# Patient Record
Sex: Female | Born: 1980 | Race: White | Hispanic: No | Marital: Married
Health system: Southern US, Community
[De-identification: ages and names within clinical notes are randomized; demographics above are authoritative.]

---

## 2019-03-22 DIAGNOSIS — Z3482 Encounter for supervision of other normal pregnancy, second trimester: Secondary | ICD-10-CM | POA: Diagnosis not present

## 2019-03-22 DIAGNOSIS — Z3483 Encounter for supervision of other normal pregnancy, third trimester: Secondary | ICD-10-CM | POA: Diagnosis not present

## 2019-04-20 DIAGNOSIS — Z3482 Encounter for supervision of other normal pregnancy, second trimester: Secondary | ICD-10-CM | POA: Diagnosis not present

## 2019-04-20 DIAGNOSIS — Z3483 Encounter for supervision of other normal pregnancy, third trimester: Secondary | ICD-10-CM | POA: Diagnosis not present

## 2019-08-04 DIAGNOSIS — Z719 Counseling, unspecified: Secondary | ICD-10-CM | POA: Diagnosis not present

## 2019-08-04 DIAGNOSIS — Z01419 Encounter for gynecological examination (general) (routine) without abnormal findings: Secondary | ICD-10-CM | POA: Diagnosis not present

## 2019-08-04 DIAGNOSIS — Z9229 Personal history of other drug therapy: Secondary | ICD-10-CM | POA: Diagnosis not present

## 2019-08-04 DIAGNOSIS — E663 Overweight: Secondary | ICD-10-CM | POA: Diagnosis not present

## 2019-10-13 ENCOUNTER — Other Ambulatory Visit: Payer: Self-pay

## 2019-10-13 ENCOUNTER — Ambulatory Visit (INDEPENDENT_AMBULATORY_CARE_PROVIDER_SITE_OTHER): Payer: 59

## 2019-10-13 ENCOUNTER — Ambulatory Visit
Admission: RE | Admit: 2019-10-13 | Discharge: 2019-10-13 | Disposition: A | Discharge status: Home / Self Care | Payer: 59 | Source: Ambulatory Visit | Attending: Emergency Medicine | Admitting: Emergency Medicine

## 2019-10-13 VITALS — BP 130/77 | HR 65 | Temp 98.1°F | Resp 15

## 2019-10-13 DIAGNOSIS — W19XXXA Unspecified fall, initial encounter: Secondary | ICD-10-CM

## 2019-10-13 DIAGNOSIS — M545 Low back pain, unspecified: Secondary | ICD-10-CM

## 2019-10-13 LAB — POCT URINE PREGNANCY: Preg Test, Ur: NEGATIVE

## 2019-10-13 MED ORDER — ACETAMINOPHEN 500 MG PO TABS: 500.0000 mg | ORAL_TABLET | Freq: Four times a day (QID) | ORAL | 0 refills | Status: AC | PRN

## 2019-10-13 MED ORDER — ACETAMINOPHEN 500 MG PO TABS: 500.0000 mg | ORAL_TABLET | Freq: Four times a day (QID) | ORAL | 0 refills | Status: DC | PRN

## 2019-10-13 MED ORDER — DEXAMETHASONE SODIUM PHOSPHATE 10 MG/ML IJ SOLN
10.0000 mg | Freq: Once | INTRAMUSCULAR | Status: AC
  Administered 2019-10-13: 10 mg via INTRAMUSCULAR

## 2019-10-13 MED ORDER — PREDNISONE 10 MG (21) PO TBPK: ORAL_TABLET | ORAL | 0 refills | Status: AC

## 2019-10-13 MED ORDER — PREDNISONE 10 MG (21) PO TBPK: ORAL_TABLET | ORAL | 0 refills | Status: DC

## 2019-10-13 NOTE — ED Provider Notes (Addendum)
[facility]   [id number] 10/13/19 Arrival Time: 1007   Chief Complaint  Patient presents with  . Back Pain      SUBJECTIVE: History from: patient.  Cassie Montes is a 39 y.o. female who presented to the urgent care with a complaint of low pain that occurred this past Saturday.  Reported she fell.  She localizes the pain to the right low back.  She describes the pain as constant and achy.  She has tried OTC medications without relief.  Her symptoms are made worse with ROM.  She denies similar symptoms in the past.    ROS: As per HPI.  All other pertinent ROS negative.     History reviewed. No pertinent past medical history. History reviewed. No pertinent surgical history. No Known Allergies No current facility-administered medications on file prior to encounter.   No current outpatient medications on file prior to encounter.   Social History   Socioeconomic History  . Marital status: Married    Spouse name: Not on file  . Number of children: Not on file  . Years of education: Not on file  . Highest education level: Not on file  Occupational History  . Not on file  Tobacco Use  . Smoking status: Not on file  Substance and Sexual Activity  . Alcohol use: Not on file  . Drug use: Not on file  . Sexual activity: Not on file  Other Topics Concern  . Not on file  Social History Narrative  . Not on file   Social Determinants of Health   Financial Resource Strain:   . Difficulty of Paying Living Expenses: Not on file  Food Insecurity:   . Worried About Programme researcher, broadcasting/film/video in the Last Year: Not on file  . Ran Out of Food in the Last Year: Not on file  Transportation Needs:   . Lack of Transportation (Medical): Not on file  . Lack of Transportation (Non-Medical): Not on file  Physical Activity:   . Days of Exercise per Week: Not on file  . Minutes of Exercise per Session: Not on file  Stress:   . Feeling of Stress : Not on file  Social Connections:     . Frequency of Communication with Friends and Family: Not on file  . Frequency of Social Gatherings with Friends and Family: Not on file  . Attends Religious Services: Not on file  . Active Member of Clubs or Organizations: Not on file  . Attends Banker Meetings: Not on file  . Marital Status: Not on file  Intimate Partner Violence:   . Fear of Current or Ex-Partner: Not on file  . Emotionally Abused: Not on file  . Physically Abused: Not on file  . Sexually Abused: Not on file   History reviewed. No pertinent family history.  OBJECTIVE:  Vitals:   10/13/19 1021  BP: 130/77  Pulse: 65  Resp: 15  Temp: 98.1 F (36.7 C)  SpO2: 99%     Physical Exam Vitals and nursing note reviewed.  Constitutional:      General: She is not in acute distress.    Appearance: Normal appearance. She is normal weight. She is not ill-appearing, toxic-appearing or diaphoretic.  HENT:     Head: Normocephalic.  Cardiovascular:     Rate and Rhythm: Normal rate and regular rhythm.     Pulses: Normal pulses.     Heart sounds: Normal heart sounds. No murmur heard.  No friction rub.  No gallop.   Pulmonary:     Effort: Pulmonary effort is normal. No respiratory distress.     Breath sounds: Normal breath sounds. No stridor. No wheezing, rhonchi or rales.  Chest:     Chest wall: No tenderness.  Musculoskeletal:        General: Tenderness present.     Lumbar back: Spasms and tenderness present.     Comments: Back:  Patient ambulates from chair to exam table without difficulty.  Inspection: Skin clear and intact without obvious swelling, erythema, or ecchymosis. Warm to the touch  Palpation: Vertebral processes nontender. Tenderness about the lower right paravertebral muscles  ROM: FROM Strength: 5/5 hip flexion, 5/5 knee extension, 5/5 knee flexion, 5/5 plantar flexion, 5/5 dorsiflexion    Neurological:     Mental Status: She is alert and oriented to person, place, and time.       LABS:  Results for orders placed or performed during the hospital encounter of 10/13/19 (from the past 24 hour(s))  POCT urine pregnancy     Status: None   Collection Time: 10/13/19 10:48 AM  Result Value Ref Range   Preg Test, Ur Negative Negative    RADIOLOGY:  DG Lumbar Spine Complete  Result Date: 10/13/2019 CLINICAL DATA:  Larey Seat 5 days ago. Back pain. EXAM: LUMBAR SPINE - COMPLETE 4+ VIEW COMPARISON:  None. FINDINGS: Normal alignment of the lumbar vertebral bodies. Disc spaces and vertebral bodies are maintained. No acute lumbar spine fracture. The facets are normally aligned. No pars defects. The visualized bony pelvis is intact. Paraspinal soft tissue densities are maintained. IMPRESSION: Normal alignment and no acute bony findings. Electronically Signed   By: Rudie Meyer M.D.   On: 10/13/2019 11:11    Lumbar spine x-ray is negative for bony abnormality including fracture or dislocation.  I have reviewed the x-ray myself and the radiologist interpretation.  I am in agreement with the radiologist interpretation.  ASSESSMENT & PLAN:  1. Fall, initial encounter   2. Acute low back pain without sciatica, unspecified back pain laterality     Meds ordered this encounter  Medications  . dexamethasone (DECADRON) injection 10 mg  . acetaminophen (TYLENOL) 500 MG tablet    Sig: Take 1 tablet (500 mg total) by mouth every 6 (six) hours as needed.    Dispense:  30 tablet    Refill:  0  . predniSONE (STERAPRED UNI-PAK 21 TAB) 10 MG (21) TBPK tablet    Sig: Take 6 tabs by mouth daily  for 1 days, then 5 tabs for 1 days, then 4 tabs for 1 days, then 3 tabs for 1 days, 2 tabs for 1 days, then 1 tab by mouth daily for 1 days    Dispense:  21 tablet    Refill:  0    Rest, ice and heat as needed Ensure adequate ROM as tolerated. Prescribed prednisone taper Continue to use OTC Tylenol as needed for pain Return here or go to ER if you have any new or worsening symptoms such as  numbness/tingling of the inner thighs, loss of bladder or bowel control, headache/blurry vision, nausea/vomiting, confusion/altered mental status, dizziness, weakness, passing out, imbalance, etc...    Reviewed expectations re: course of current medical issues. Questions answered. Outlined signs and symptoms indicating need for more acute intervention. Patient verbalized understanding. After Visit Summary given.         Durward Parcel, FNP 10/13/19 1127    Durward Parcel, FNP 10/13/19 1128

## 2019-10-13 NOTE — ED Triage Notes (Signed)
Patient states that she fell last saturday, has some tendeness and soreness along right side lower back/hip area. Patient is ambulatory and walking well.

## 2019-10-13 NOTE — Discharge Instructions (Addendum)
Rest, ice and heat as needed Ensure adequate ROM as tolerated. Prescribed prednisone taper Continue to use OTC Tylenol as needed for pain Return here or go to ER if you have any new or worsening symptoms such as numbness/tingling of the inner thighs, loss of bladder or bowel control, headache/blurry vision, nausea/vomiting, confusion/altered mental status, dizziness, weakness, passing out, imbalance, etc..Marland Kitchen

## 2021-05-31 IMAGING — DX DG LUMBAR SPINE COMPLETE 4+V
5 series · 5 of 5 positions shown · non-contrast
Comparison: None.

CLINICAL DATA: Fell 5 days ago. Back pain.

EXAM:
LUMBAR SPINE - COMPLETE 4+ VIEW

[lumbar spine ap]
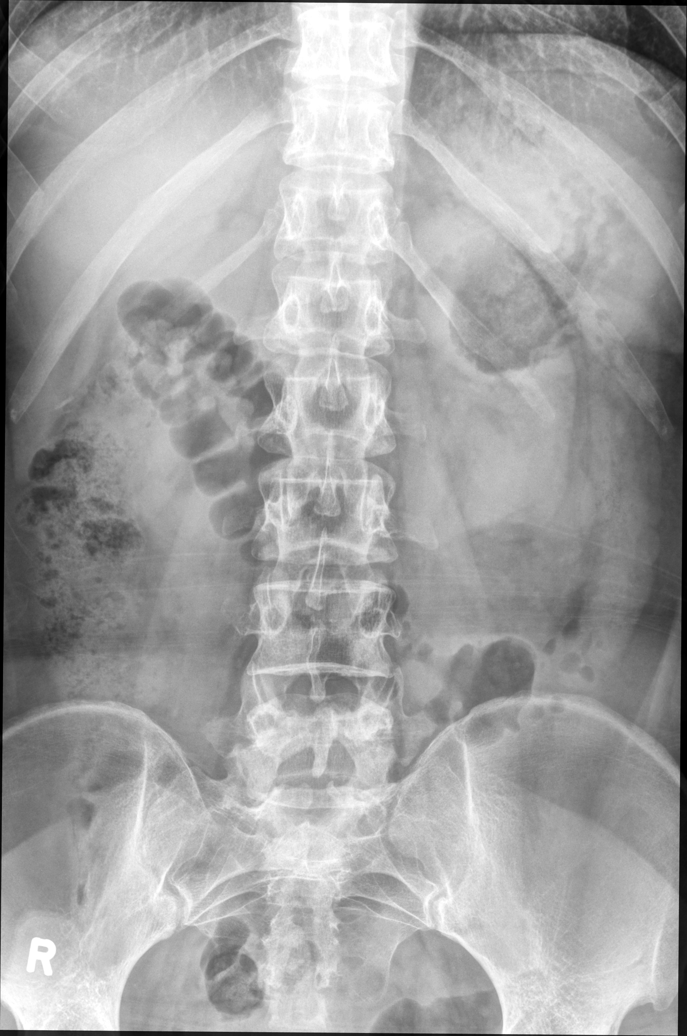

[lumbar spine mlo (1 of 2)]
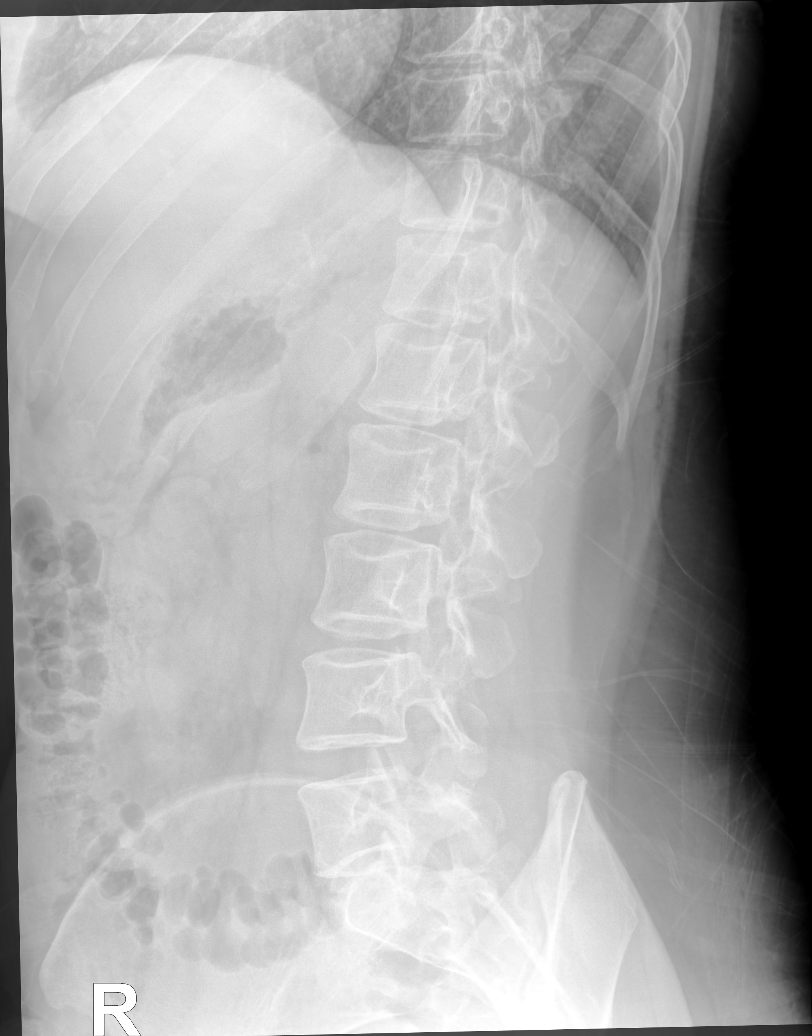

[lumbar spine mlo (2 of 2)]
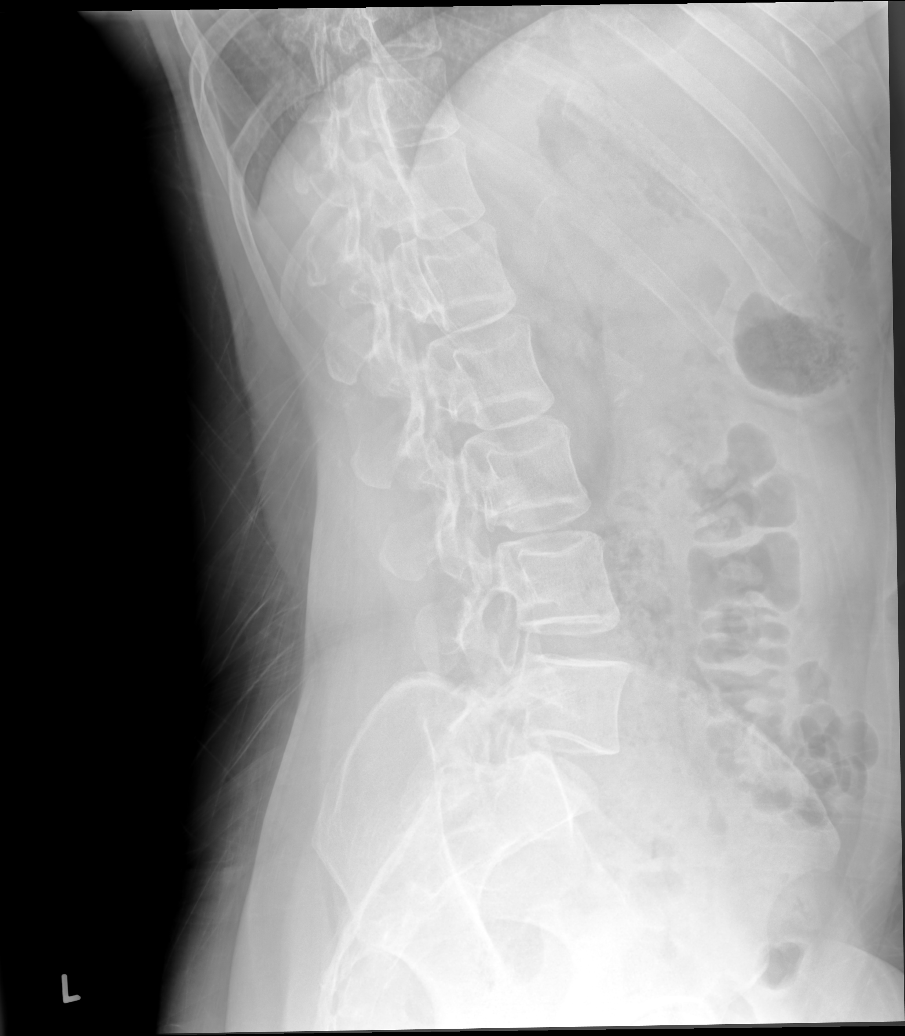

[lumbar spine lat (1 of 2)]
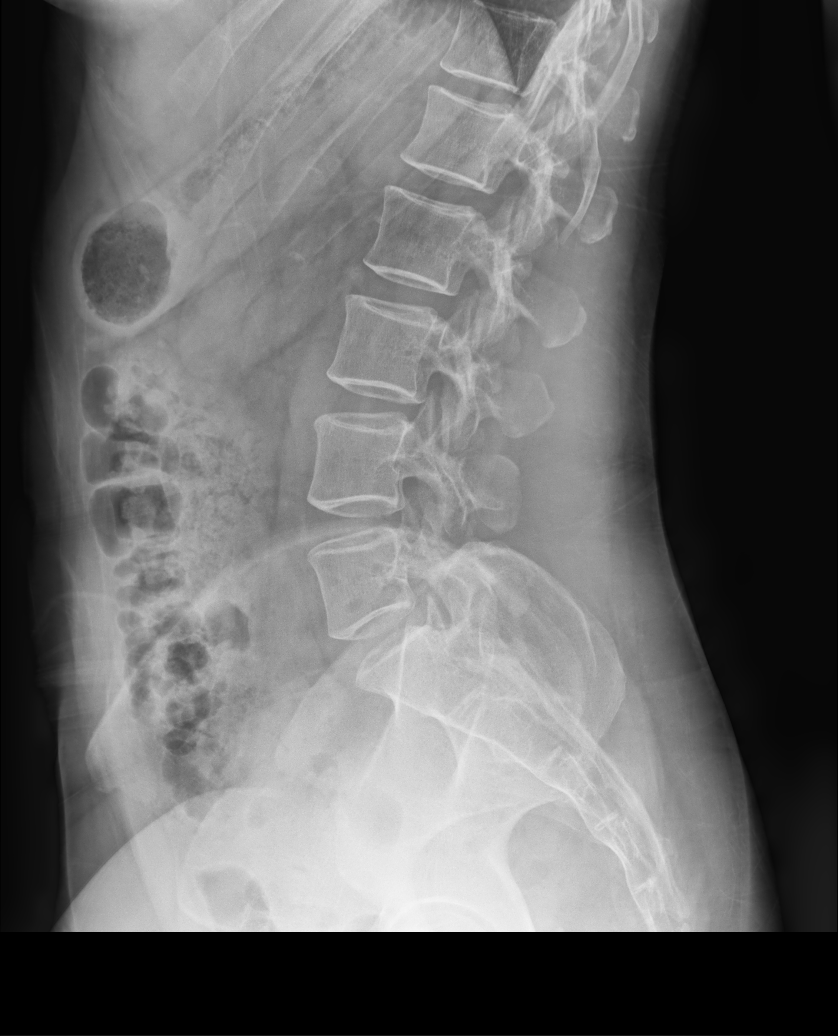

[lumbar spine lat (2 of 2)]
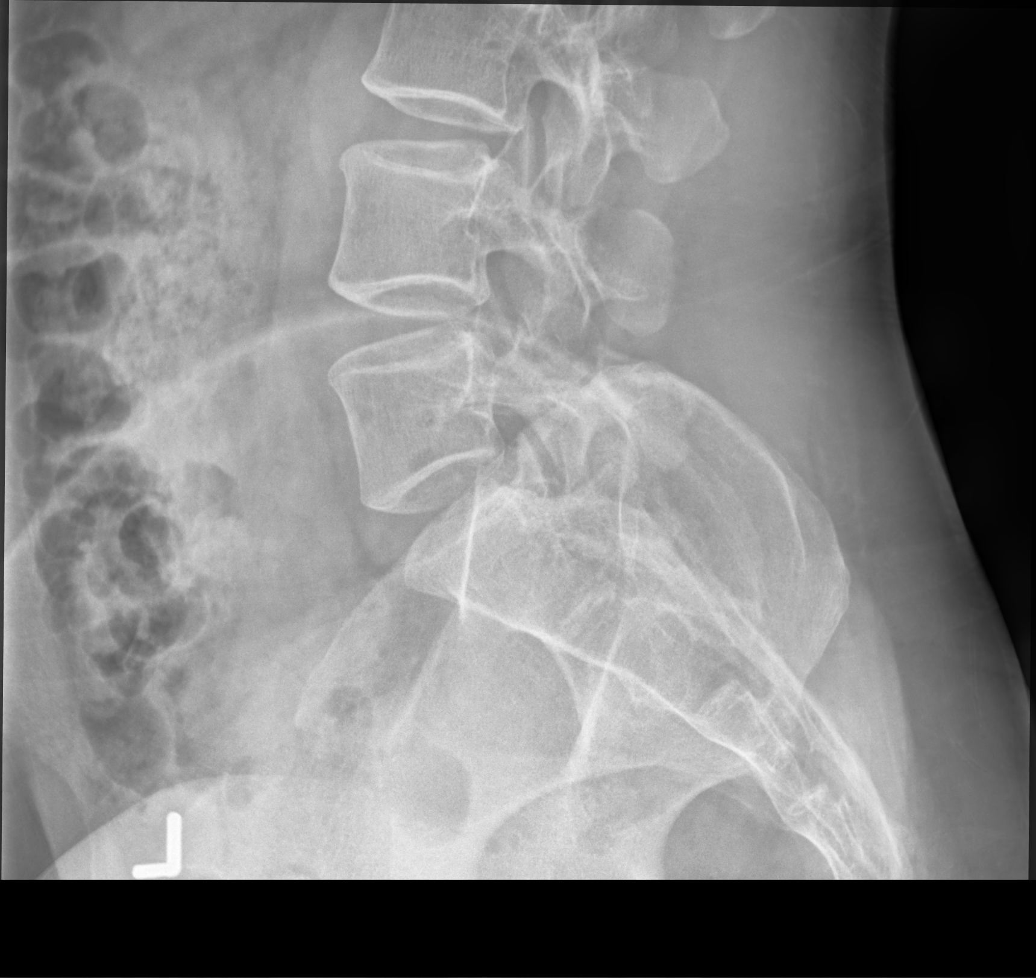

[5 of 5 positions shown; findings below may reference images not displayed]

FINDINGS: Normal alignment of the lumbar vertebral bodies. Disc spaces and
vertebral bodies are maintained. No acute lumbar spine fracture. The
facets are normally aligned. No pars defects. The visualized bony
pelvis is intact. Paraspinal soft tissue densities are maintained.
IMPRESSION: Normal alignment and no acute bony findings.
